# Patient Record
Sex: Male | Born: 1995 | Race: Black or African American | Hispanic: No | Marital: Single | Smoking: Current every day smoker
Health system: Southern US, Community
[De-identification: ages and names within clinical notes are randomized; demographics above are authoritative.]

## PROBLEM LIST (undated history)

## (undated) DIAGNOSIS — J45909 Unspecified asthma, uncomplicated: Secondary | ICD-10-CM

---
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
## 2013-03-14 ENCOUNTER — Encounter (HOSPITAL_COMMUNITY): Payer: Self-pay | Admitting: Emergency Medicine

## 2013-03-14 ENCOUNTER — Emergency Department (HOSPITAL_COMMUNITY)
Admission: EM | Admit: 2013-03-14 | Discharge: 2013-03-14 | Disposition: A | Discharge status: Home / Self Care | Payer: Medicaid Other | Attending: Emergency Medicine | Admitting: Emergency Medicine

## 2013-03-14 DIAGNOSIS — A6000 Herpesviral infection of urogenital system, unspecified: Secondary | ICD-10-CM | POA: Insufficient documentation

## 2013-03-14 DIAGNOSIS — R369 Urethral discharge, unspecified: Secondary | ICD-10-CM | POA: Insufficient documentation

## 2013-03-14 DIAGNOSIS — R300 Dysuria: Secondary | ICD-10-CM | POA: Insufficient documentation

## 2013-03-14 DIAGNOSIS — R21 Rash and other nonspecific skin eruption: Secondary | ICD-10-CM | POA: Insufficient documentation

## 2013-03-14 DIAGNOSIS — N342 Other urethritis: Secondary | ICD-10-CM

## 2013-03-14 DIAGNOSIS — F17200 Nicotine dependence, unspecified, uncomplicated: Secondary | ICD-10-CM | POA: Insufficient documentation

## 2013-03-14 DIAGNOSIS — J45909 Unspecified asthma, uncomplicated: Secondary | ICD-10-CM | POA: Insufficient documentation

## 2013-03-14 MED ORDER — CEFTRIAXONE SODIUM 250 MG IJ SOLR
250.0000 mg | Freq: Once | INTRAMUSCULAR | Status: AC
  Administered 2013-03-14: 250 mg via INTRAMUSCULAR
  Filled 2013-03-14: qty 250

## 2013-03-14 MED ORDER — LIDOCAINE HCL (PF) 1 % IJ SOLN
INTRAMUSCULAR | Status: AC
  Filled 2013-03-14: qty 5

## 2013-03-14 MED ORDER — AZITHROMYCIN 250 MG PO TABS
1000.0000 mg | ORAL_TABLET | Freq: Once | ORAL | Status: AC
  Administered 2013-03-14: 1000 mg via ORAL
  Filled 2013-03-14: qty 4

## 2013-03-14 MED ORDER — VALACYCLOVIR HCL 1 G PO TABS: 1000.0000 mg | ORAL_TABLET | Freq: Two times a day (BID) | ORAL | Status: AC
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
## 2013-03-14 NOTE — ED Provider Notes (Signed)
CSN: [id number]     Arrival date & time 03/14/13  1616 History  This chart was scribed for non-physician practitioner Rhea Bleacher, PA-C, working with Gilda Crease, by Yevette Edwards, ED Scribe. This patient was seen in room TR08C/TR08C and the patient's care was started at 4:40 PM.   First MD Initiated Contact with Patient 03/14/13 1624     Chief Complaint  Patient presents with  . Exposure to STD  . Penile Discharge    Patient is a 17 y.o. male presenting with penile discharge. The history is provided by the patient. No language interpreter was used.  Penile Discharge   HPI Comments: Ian Morgan is a 17 y.o. male who presents to the Emergency Department complaining of a suspected STD which may have occurred five days ago when he had unprotected sexual intercourse. The pt states he has noticed a rash to the shaftof his penis and he has experienced dysuria. Itching occurred prior to appearance of rash. He has also experienced clear penile discharge. He denies experiencing a fever, chills, rash, or sore throat. The pt denies a h/o STDs. The onset of this condition is acute. The course is constant. The aggravating factors are none. The alleviating factors are none.   Past Medical History  Diagnosis Date  . Asthma    Past Surgical History  Procedure Laterality Date  . Nerve and tendon repair  09/04/2012    Procedure: NERVE AND TENDON REPAIR;  Surgeon: Sharma Covert, MD;  Location: [facility] OR;  Service: Orthopedics;  Laterality: Right;   History reviewed. No pertinent family history. History  Substance Use Topics  . Smoking status: Current Every Day Smoker -- 0.50 packs/day    Types: Cigarettes  . Smokeless tobacco: Not on file  . Alcohol Use: Yes     Comment: occasion    Review of Systems  Constitutional: Negative for fever and chills.  HENT: Negative for sore throat.   Eyes: Negative for discharge.  Gastrointestinal: Negative for rectal pain.  Genitourinary: Positive for  dysuria (Burning. ), discharge and genital sores. Negative for frequency, penile pain and testicular pain.       Bumps on penis.   Musculoskeletal: Negative for arthralgias.  Skin: Negative for rash.  Hematological: Negative for adenopathy.    Allergies  Shellfish allergy  Home Medications   Current Outpatient Rx  Name  Route  Sig  Dispense  Refill  . albuterol (PROVENTIL HFA;VENTOLIN HFA) 108 (90 BASE) MCG/ACT inhaler   Inhalation   Inhale 2 puffs into the lungs every 6 (six) hours as needed. For shortness of breath         . docusate sodium (COLACE) 100 MG capsule   Oral   Take 1 capsule (100 mg total) by mouth 2 (two) times daily.   30 capsule   0   . oxyCODONE-acetaminophen (PERCOCET) 10-325 MG per tablet   Oral   Take 1 tablet by mouth every 4 (four) hours as needed for pain.   40 tablet   0    Triage Vitals: BP 131/74  Pulse 79  Temp(Src) 98.4 F (36.9 C) (Oral)  Resp 18  SpO2 99% Physical Exam  Nursing note and vitals reviewed. Constitutional: He appears well-developed and well-nourished.  HENT:  Head: Normocephalic and atraumatic.  Eyes: Conjunctivae are normal.  Neck: Normal range of motion. Neck supple.  Pulmonary/Chest: No respiratory distress.  Genitourinary:    Right testis shows no mass, no swelling and no tenderness. Left testis shows no mass,  no swelling and no tenderness. Circumcised. No phimosis or penile erythema. Discharge (clear) found.  Neurological: He is alert.  Skin: Skin is warm and dry.  Psychiatric: He has a normal mood and affect.    ED Course   DIAGNOSTIC STUDIES: Oxygen Saturation is 99% on room air, normal by my interpretation.    COORDINATION OF CARE:  4:42 PM- Discussed treatment plan with patient, and the patient agreed to the plan.   Procedures (including critical care time)  Labs Reviewed - No data to display No results found. 1. Urethritis   2. Herpes genitalia     Patient seen and examined. Work-up  initiated. Medications ordered.   Vital signs reviewed and are as follows: Filed Vitals:   03/14/13 1631  BP: 131/74  Pulse: 79  Temp: 98.4 F (36.9 C)  Resp: 18   Pt seen and examined.  Will test and treat for STD exposure.  Patient counseled on safe sexual practices.  Told them that they should not have sexual contact for next 7 days and that they need to inform sexual partners so that they can get tested and treated as well.  Urged f/u with [place] [facility] for HIV and syphilis testing.  Patient verbalizes understanding and agrees with plan.     MDM  Patient with urethritis, genital herpes. Patient counseled. Rx given for the diagnosis of herpes. Do not suspect syphilis.  I personally performed the services described in this documentation, which was scribed in my presence. The recorded information has been reviewed and is accurate.    Renne Crigler, PA-C 03/14/13 1753

## 2013-03-15 NOTE — ED Provider Notes (Signed)
Medical screening examination/treatment/procedure(s) were performed by non-physician practitioner and as supervising physician I was immediately available for consultation/collaboration.   Gilda Crease, MD 03/15/13 1745

## 2013-03-17 LAB — GC/CHLAMYDIA PROBE AMP: CT Probe RNA: POSITIVE — AB

## 2013-03-19 NOTE — ED Notes (Signed)
+   Chlamydia Patient treated with Rocephin And Zithromax-

## 2013-05-15 ENCOUNTER — Encounter (HOSPITAL_COMMUNITY): Payer: Self-pay | Admitting: *Deleted

## 2013-05-15 ENCOUNTER — Emergency Department (HOSPITAL_COMMUNITY)
Admission: EM | Admit: 2013-05-15 | Discharge: 2013-05-15 | Disposition: A | Discharge status: Home / Self Care | Payer: Medicaid Other | Attending: Emergency Medicine | Admitting: Emergency Medicine

## 2013-05-15 DIAGNOSIS — J45901 Unspecified asthma with (acute) exacerbation: Secondary | ICD-10-CM | POA: Insufficient documentation

## 2013-05-15 DIAGNOSIS — J069 Acute upper respiratory infection, unspecified: Secondary | ICD-10-CM | POA: Insufficient documentation

## 2013-05-15 DIAGNOSIS — F17200 Nicotine dependence, unspecified, uncomplicated: Secondary | ICD-10-CM | POA: Insufficient documentation

## 2013-05-15 DIAGNOSIS — Z79899 Other long term (current) drug therapy: Secondary | ICD-10-CM | POA: Insufficient documentation

## 2013-05-15 DIAGNOSIS — R51 Headache: Secondary | ICD-10-CM | POA: Insufficient documentation

## 2013-05-15 MED ORDER — ALBUTEROL SULFATE HFA 108 (90 BASE) MCG/ACT IN AERS
2.0000 | INHALATION_SPRAY | RESPIRATORY_TRACT | Status: DC | PRN
  Administered 2013-05-15: 2 via RESPIRATORY_TRACT
  Filled 2013-05-15: qty 6.7

## 2013-05-15 MED ORDER — ALBUTEROL SULFATE HFA 108 (90 BASE) MCG/ACT IN AERS: 1.0000 | INHALATION_SPRAY | Freq: Four times a day (QID) | RESPIRATORY_TRACT | Status: DC | PRN

## 2013-05-15 MED ORDER — AZITHROMYCIN 250 MG PO TABS: 250.0000 mg | ORAL_TABLET | Freq: Every day | ORAL | Status: DC

## 2013-05-15 NOTE — ED Notes (Signed)
Pt with hx of asthma to ED c/o increasing sob since Sunday.  Does not own inhaler and smokes 1/2 pack cigs per day. VS stable.

## 2013-05-15 NOTE — ED Notes (Signed)
Pt reports hx of asthma but does not have inhaler.  Pt states he has felt his chest getting tighter since Monday.  Productive cough and exp. Wheeze, no obvious distress.  Pt alert oriented X4

## 2013-05-15 NOTE — ED Provider Notes (Signed)
CSN: [id number]     Arrival date & time 05/15/13  1045 History  This chart was scribed for Ian Furno G. Neva Seat, PA, working with Ian Gaskins, MD, by Ian Morgan, ED Scribe. This patient was seen in room TR10C/TR10C and the patient's care was started at 11:39 AM.  Chief Complaint  Patient presents with  . Asthma   The history is provided by the patient. No language interpreter was used.    HPI Comments: Ian Morgan is a 17 y.o. male who presents to the Emergency Department complaining of gradually worsening, constant SOB which began 4 days ago. Pt reports he has subjective seasonal cold symptoms which worsens his asthma. Pt has associated sore throat, rhinorrhea, headache, wheezing, and SOB. Pt currently does not have an inhaler. Pt denies fever, chills, chest pain or any other symptoms.      Past Medical History  Diagnosis Date  . Asthma    Past Surgical History  Procedure Laterality Date  . Nerve and tendon repair  09/04/2012    Procedure: NERVE AND TENDON REPAIR;  Surgeon: Ian Covert, MD;  Location: [facility] OR;  Service: Orthopedics;  Laterality: Right;   No family history on file. History  Substance Use Topics  . Smoking status: Current Every Day Smoker -- 0.50 packs/day    Types: Cigarettes  . Smokeless tobacco: Not on file  . Alcohol Use: Yes     Comment: occasion    Review of Systems  Constitutional: Negative for fever and chills.  HENT: Positive for sore throat and rhinorrhea.   Respiratory: Positive for cough, shortness of breath and wheezing. Negative for chest tightness.   Cardiovascular: Negative for chest pain.  Neurological: Positive for headaches.  All other systems reviewed and are negative.    Allergies  Shellfish allergy  Home Medications   Current Outpatient Rx  Name  Route  Sig  Dispense  Refill  . Ibuprofen (IBU PO)   Oral   Take 1 tablet by mouth every 6 (six) hours as needed (pain).         Marland Kitchen albuterol (PROVENTIL HFA;VENTOLIN HFA)  108 (90 BASE) MCG/ACT inhaler   Inhalation   Inhale 1-2 puffs into the lungs every 6 (six) hours as needed for wheezing.   1 Inhaler   2   . azithromycin (ZITHROMAX) 250 MG tablet   Oral   Take 1 tablet (250 mg total) by mouth daily. Take first 2 tablets together, then 1 every day until finished.   6 tablet   0    Triage Vitals: BP 136/83  Pulse 79  Temp(Src) 98.5 F (36.9 C) (Oral)  Resp 18  SpO2 98%  Physical Exam  Nursing note and vitals reviewed. Constitutional: He is oriented to person, place, and time. He appears well-developed and well-nourished. No distress.  HENT:  Head: Normocephalic and atraumatic.  Right Ear: External ear normal.  Left Ear: External ear normal.  Nose: Nose normal.  Eyes: Conjunctivae are normal.  Neck: Neck supple.  Pulmonary/Chest: He has wheezes (diffuse wheezing ). He has no rales. He exhibits no tenderness.  Neurological: He is alert and oriented to person, place, and time.  Skin: Skin is warm and dry. He is not diaphoretic.  Psychiatric: He has a normal mood and affect.    ED Course  Procedures (including critical care time)  DIAGNOSTIC STUDIES: Oxygen Saturation is 98% on RA, normal by my interpretation.    COORDINATION OF CARE: 11:41 AM- Pt advised of plan for treatment which  includes albuterol inhaler and pt agrees. Will prescribe albuterol inhaler and antibiotic  Medications  albuterol (PROVENTIL HFA;VENTOLIN HFA) 108 (90 BASE) MCG/ACT inhaler 2 puff (2 puffs Inhalation Given 05/15/13 1146)   Labs Review Labs Reviewed - No data to display Imaging Review No results found.  MDM   1. URI (upper respiratory infection)    17 y.o.Ian Morgan's evaluation in the Emergency Department is complete. It has been determined that no acute conditions requiring further emergency intervention are present at this time. The patient/guardian have been advised of the diagnosis and plan. We have discussed signs and symptoms that warrant return  to the ED, such as changes or worsening in symptoms.  Vital signs are stable at discharge. Filed Vitals:   05/15/13 1206  BP: 138/80  Pulse: 70  Temp: 98.8 F (37.1 C)  Resp:     Patient/guardian has voiced understanding and agreed to follow-up with the PCP or specialist.  I personally performed the services described in this documentation, which was scribed in my presence. The recorded information has been reviewed and is accurate.   Ian Matas, PA-C 05/15/13 1216

## 2013-05-16 NOTE — ED Provider Notes (Signed)
Medical screening examination/treatment/procedure(s) were performed by non-physician practitioner and as supervising physician I was immediately available for consultation/collaboration.   Joya Gaskins, MD 05/16/13 [phone]

## 2013-11-05 ENCOUNTER — Emergency Department (HOSPITAL_COMMUNITY): Payer: Medicaid Other

## 2013-11-05 ENCOUNTER — Encounter (HOSPITAL_COMMUNITY): Payer: Self-pay | Admitting: Emergency Medicine

## 2013-11-05 ENCOUNTER — Emergency Department (HOSPITAL_COMMUNITY)
Admission: EM | Admit: 2013-11-05 | Discharge: 2013-11-05 | Disposition: A | Discharge status: Home / Self Care | Payer: Medicaid Other | Attending: Emergency Medicine | Admitting: Emergency Medicine

## 2013-11-05 DIAGNOSIS — IMO0002 Reserved for concepts with insufficient information to code with codable children: Secondary | ICD-10-CM | POA: Insufficient documentation

## 2013-11-05 DIAGNOSIS — Z79899 Other long term (current) drug therapy: Secondary | ICD-10-CM | POA: Insufficient documentation

## 2013-11-05 DIAGNOSIS — F17200 Nicotine dependence, unspecified, uncomplicated: Secondary | ICD-10-CM | POA: Insufficient documentation

## 2013-11-05 DIAGNOSIS — J45901 Unspecified asthma with (acute) exacerbation: Secondary | ICD-10-CM

## 2013-11-05 MED ORDER — PREDNISONE 20 MG PO TABS
60.0000 mg | ORAL_TABLET | Freq: Once | ORAL | Status: AC
  Administered 2013-11-05: 60 mg via ORAL
  Filled 2013-11-05: qty 3

## 2013-11-05 MED ORDER — FLUTICASONE PROPIONATE HFA 44 MCG/ACT IN AERO: 2.0000 | INHALATION_SPRAY | Freq: Two times a day (BID) | RESPIRATORY_TRACT | Status: AC

## 2013-11-05 MED ORDER — PREDNISONE 20 MG PO TABS: ORAL_TABLET | ORAL | Status: DC

## 2013-11-05 MED ORDER — ALBUTEROL SULFATE (2.5 MG/3ML) 0.083% IN NEBU
5.0000 mg | INHALATION_SOLUTION | Freq: Once | RESPIRATORY_TRACT | Status: AC
  Administered 2013-11-05: 5 mg via RESPIRATORY_TRACT
  Filled 2013-11-05: qty 6

## 2013-11-05 MED ORDER — ALBUTEROL SULFATE HFA 108 (90 BASE) MCG/ACT IN AERS
2.0000 | INHALATION_SPRAY | Freq: Four times a day (QID) | RESPIRATORY_TRACT | Status: DC | PRN
  Filled 2013-11-05: qty 6.7

## 2013-11-05 NOTE — ED Notes (Addendum)
Pt reports hx of asthma, has not used inhaler today. Pt reports productive cough yellow/green sputum, shortness of breath, and mid sternal chest pain upon inspiration x3 days. Shortness of breath increased today. Pt able to speak in full sentances with no distress. Pain 5/10. Expiratory wheezes heard in right lower lobes.   Hx 5 year smoker, 1/2 pack/day

## 2013-11-05 NOTE — Discharge Instructions (Signed)
Asthma, Adult Asthma is a recurring condition in which the airways tighten and narrow. Asthma can make it difficult to breathe. It can cause coughing, wheezing, and shortness of breath. Asthma episodes (also called asthma attacks) range from minor to life-threatening. Asthma cannot be cured, but medicines and lifestyle changes can help control it. CAUSES Asthma is believed to be caused by inherited (genetic) and environmental factors, but its exact cause is unknown. Asthma may be triggered by allergens, lung infections, or irritants in the air. Asthma triggers are different for each person. Common triggers include:   Animal dander.  Dust mites.  Cockroaches.  Pollen from trees or grass.  Mold.  Smoke.  Air pollutants such as dust, household cleaners, hair sprays, aerosol sprays, paint fumes, strong chemicals, or strong odors.  Cold air, weather changes, and winds (which increase molds and pollens in the air).  Strong emotional expressions such as crying or laughing hard.  Stress.  Certain medicines (such as aspirin) or types of drugs (such as beta-blockers).  Sulfites in foods and drinks. Foods and drinks that may contain sulfites include dried fruit, potato chips, and sparkling grape juice.  Infections or inflammatory conditions such as the flu, a cold, or an inflammation of the nasal membranes (rhinitis).  Gastroesophageal reflux disease (GERD).  Exercise or strenuous activity. SYMPTOMS Symptoms may occur immediately after asthma is triggered or many hours later. Symptoms include:  Wheezing.  Excessive nighttime or early morning coughing.  Frequent or severe coughing with a common cold.  Chest tightness.  Shortness of breath. DIAGNOSIS  The diagnosis of asthma is made by a review of your medical history and a physical exam. Tests may also be performed. These may include:  Lung function studies. These tests show how much air you breath in and out.  Allergy  tests.  Imaging tests such as X-rays. TREATMENT  Asthma cannot be cured, but it can usually be controlled. Treatment involves identifying and avoiding your asthma triggers. It also involves medicines. There are 2 classes of medicine used for asthma treatment:   Controller medicines. These prevent asthma symptoms from occurring. They are usually taken every day.  Reliever or rescue medicines. These quickly relieve asthma symptoms. They are used as needed and provide short-term relief. Your health care provider will help you create an asthma action plan. An asthma action plan is a written plan for managing and treating your asthma attacks. It includes a list of your asthma triggers and how they may be avoided. It also includes information on when medicines should be taken and when their dosage should be changed. An action plan may also involve the use of a device called a peak flow meter. A peak flow meter measures how well the lungs are working. It helps you monitor your condition. HOME CARE INSTRUCTIONS   Take medicine as directed by your health care provider. Speak with your health care provider if you have questions about how or when to take the medicines.  Use a peak flow meter as directed by your health care provider. Record and keep track of readings.  Understand and use the action plan to help minimize or stop an asthma attack without needing to seek medical care.  Control your home environment in the following ways to help prevent asthma attacks:  Do not smoke. Avoid being exposed to secondhand smoke.  Change your heating and air conditioning filter regularly.  Limit your use of fireplaces and wood stoves.  Get rid of pests (such as roaches and   mice) and their droppings.  Throw away plants if you see mold on them.  Clean your floors and dust regularly. Use unscented cleaning products.  Try to have someone else vacuum for you regularly. Stay out of rooms while they are being  vacuumed and for a short while afterward. If you vacuum, use a dust mask from a hardware store, a double-layered or microfilter vacuum cleaner bag, or a vacuum cleaner with a HEPA filter.  Replace carpet with wood, tile, or vinyl flooring. Carpet can trap dander and dust.  Use allergy-proof pillows, mattress covers, and box spring covers.  Wash bed sheets and blankets every week in hot water and dry them in a dryer.  Use blankets that are made of polyester or cotton.  Clean bathrooms and kitchens with bleach. If possible, have someone repaint the walls in these rooms with mold-resistant paint. Keep out of the rooms that are being cleaned and painted.  Wash hands frequently. SEEK MEDICAL CARE IF:   You have wheezing, shortness of breath, or a cough even if taking medicine to prevent attacks.  The colored mucus you cough up (sputum) is thicker than usual.  Your sputum changes from clear or white to yellow, green, gray, or bloody.  You have any problems that may be related to the medicines you are taking (such as a rash, itching, swelling, or trouble breathing).  You are using a reliever medicine more than 2 3 times per week.  Your peak flow is still at 50 79% of you personal best after following your action plan for 1 hour. SEEK IMMEDIATE MEDICAL CARE IF:   You seem to be getting worse and are unresponsive to treatment during an asthma attack.  You are short of breath even at rest.  You get short of breath when doing very little physical activity.  You have difficulty eating, drinking, or talking due to asthma symptoms.  You develop chest pain.  You develop a fast heartbeat.  You have a bluish color to your lips or fingernails.  You are lightheaded, dizzy, or faint.  Your peak flow is less than 50% of your personal best.  You have a fever or persistent symptoms for more than 2 3 days.  You have a fever and symptoms suddenly get worse. MAKE SURE YOU:   Understand these  instructions.  Will watch your condition.  Will get help right away if you are not doing well or get worse. Document Released: 07/31/2005 Document Revised: 04/02/2013 Document Reviewed: 02/27/2013 [employer] Patient Information 2014 [employer].  

## 2013-11-05 NOTE — ED Provider Notes (Signed)
CSN: [id number]     Arrival date & time 11/05/13  0904 History   First MD Initiated Contact with Patient 11/05/13 [phone]     Chief Complaint  Patient presents with  . Shortness of Breath     (Consider location/radiation/quality/duration/timing/severity/associated sxs/prior Treatment) HPI Comments: Patient presents with wheezing and shortness of breath. He has a history of asthma and he continues to smoke cigarettes. He states over the last 3 days he's had worsening shortness of breath and wheezing. He feels like he has a "plug" in the middle of his chest which is worse when he takes a deep breath. He denies any leg pain or swelling. He denies any fevers or chills. He does have a cough productive of some occasional green sputum. He uses an albuterol MDI at home for symptomatic OB with his asthma and is been using it frequently over the last few days with some improvement in symptoms but he says it comes right back.  Patient is a 18 y.o. male presenting with shortness of breath.  Shortness of Breath Associated symptoms: chest pain, cough and wheezing   Associated symptoms: no abdominal pain, no diaphoresis, no fever, no headaches, no rash and no vomiting     Past Medical History  Diagnosis Date  . Asthma    Past Surgical History  Procedure Laterality Date  . Nerve and tendon repair  09/04/2012    Procedure: NERVE AND TENDON REPAIR;  Surgeon: Sharma Covert, MD;  Location: [facility] OR;  Service: Orthopedics;  Laterality: Right;   History reviewed. No pertinent family history. History  Substance Use Topics  . Smoking status: Current Every Day Smoker -- 0.50 packs/day    Types: Cigarettes  . Smokeless tobacco: Not on file  . Alcohol Use: Yes     Comment: occasion    Review of Systems  Constitutional: Negative for fever, chills, diaphoresis and fatigue.  HENT: Negative for congestion, rhinorrhea and sneezing.   Eyes: Negative.   Respiratory: Positive for cough, shortness of breath and  wheezing. Negative for chest tightness.   Cardiovascular: Positive for chest pain. Negative for leg swelling.  Gastrointestinal: Negative for nausea, vomiting, abdominal pain, diarrhea and blood in stool.  Genitourinary: Negative for frequency, hematuria, flank pain and difficulty urinating.  Musculoskeletal: Negative for arthralgias and back pain.  Skin: Negative for rash.  Neurological: Negative for dizziness, speech difficulty, weakness, numbness and headaches.      Allergies  Shellfish allergy  Home Medications   Current Outpatient Rx  Name  Route  Sig  Dispense  Refill  . albuterol (PROVENTIL HFA;VENTOLIN HFA) 108 (90 BASE) MCG/ACT inhaler   Inhalation   Inhale 1-2 puffs into the lungs every 6 (six) hours as needed for wheezing.   1 Inhaler   2   . azithromycin (ZITHROMAX) 250 MG tablet   Oral   Take 1 tablet (250 mg total) by mouth daily. Take first 2 tablets together, then 1 every day until finished.   6 tablet   0   . fluticasone (FLOVENT HFA) 44 MCG/ACT inhaler   Inhalation   Inhale 2 puffs into the lungs 2 (two) times daily.   1 Inhaler   1   . Ibuprofen (IBU PO)   Oral   Take 1 tablet by mouth every 6 (six) hours as needed (pain).         . predniSONE (DELTASONE) 20 MG tablet      2 po daily x 4 days   8 tablet  0    BP 145/83  Pulse 68  Temp(Src) 98.4 F (36.9 C) (Oral)  Resp 16  SpO2 100% Physical Exam  Constitutional: He is oriented to person, place, and time. He appears well-developed and well-nourished.  HENT:  Head: Normocephalic and atraumatic.  Eyes: Pupils are equal, round, and reactive to light.  Neck: Normal range of motion. Neck supple.  Cardiovascular: Normal rate, regular rhythm and normal heart sounds.   Pulmonary/Chest: Effort normal. No respiratory distress. He has wheezes. He has no rales. He exhibits no tenderness.  Positive expiratory wheezes but no increased work of breathing  Abdominal: Soft. Bowel sounds are normal.  There is no tenderness. There is no rebound and no guarding.  Musculoskeletal: Normal range of motion. He exhibits no edema.  No edema or calf tenderness  Lymphadenopathy:    He has no cervical adenopathy.  Neurological: He is alert and oriented to person, place, and time.  Skin: Skin is warm and dry. No rash noted.  Psychiatric: He has a normal mood and affect.    ED Course  Procedures (including critical care time) Labs Review Labs Reviewed - No data to display Imaging Review No results found.   EKG Interpretation None      MDM   Final diagnoses:  Asthma exacerbation    Patient presents with an asthma exacerbation. His chest x-ray is negative for infection. He's afebrile. He was given a nebulizer treatment here and had complete relief of symptoms. He currently denies any chest pain or shortness of breath. There's no other symptoms that would be more worrisome for pulmonary embolus. I did counsel him on tobacco cessation. He was given an albuterol MDI to take home. He was given a prescription for a four-day course of prednisone to start tomorrow and was given his first dose of prednisone in the ED today. He was also started on Flovent inhaler and encouraged to followup with his primary care physician for ongoing asthma management.    Rolan BuccoMelanie Cammie Faulstich, MD 11/05/13 1017

## 2014-08-01 IMAGING — CR DG HAND COMPLETE 3+V*R*
3 series · 3 of 3 positions shown · non-contrast
Comparison: None.

CLINICAL DATA: Puncture wound

RIGHT HAND - COMPLETE 3+ VIEW

[x hand pa right]
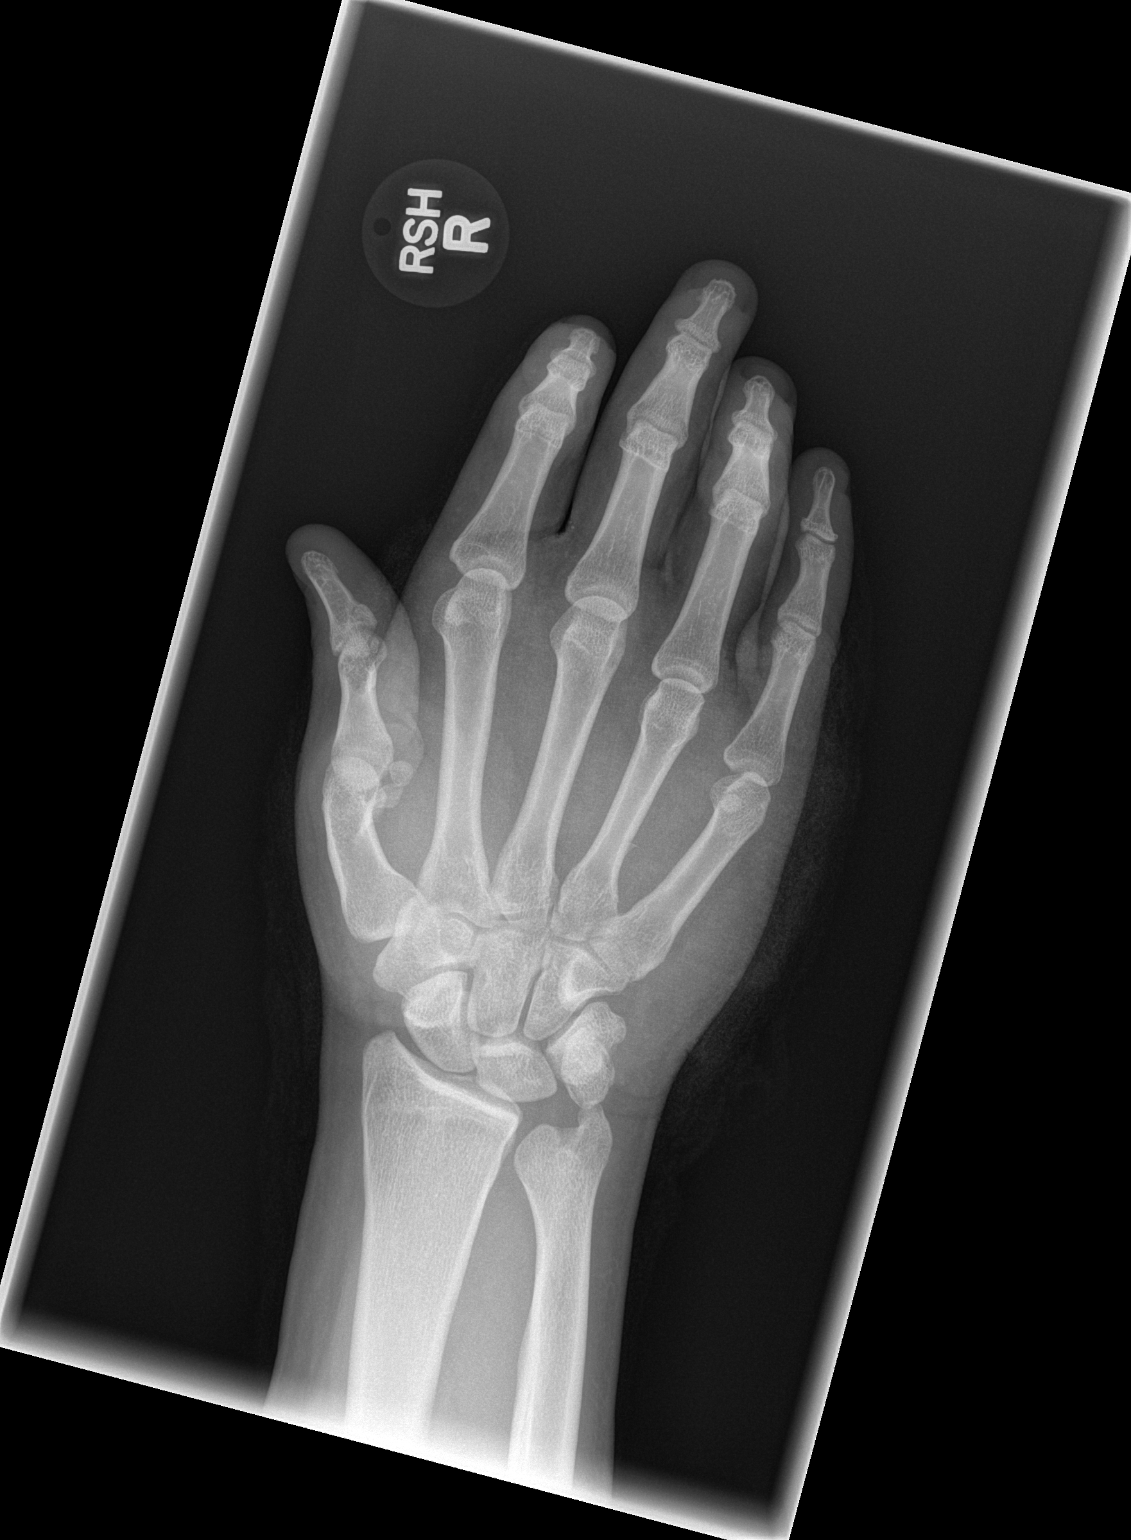

[x hand oblique right]
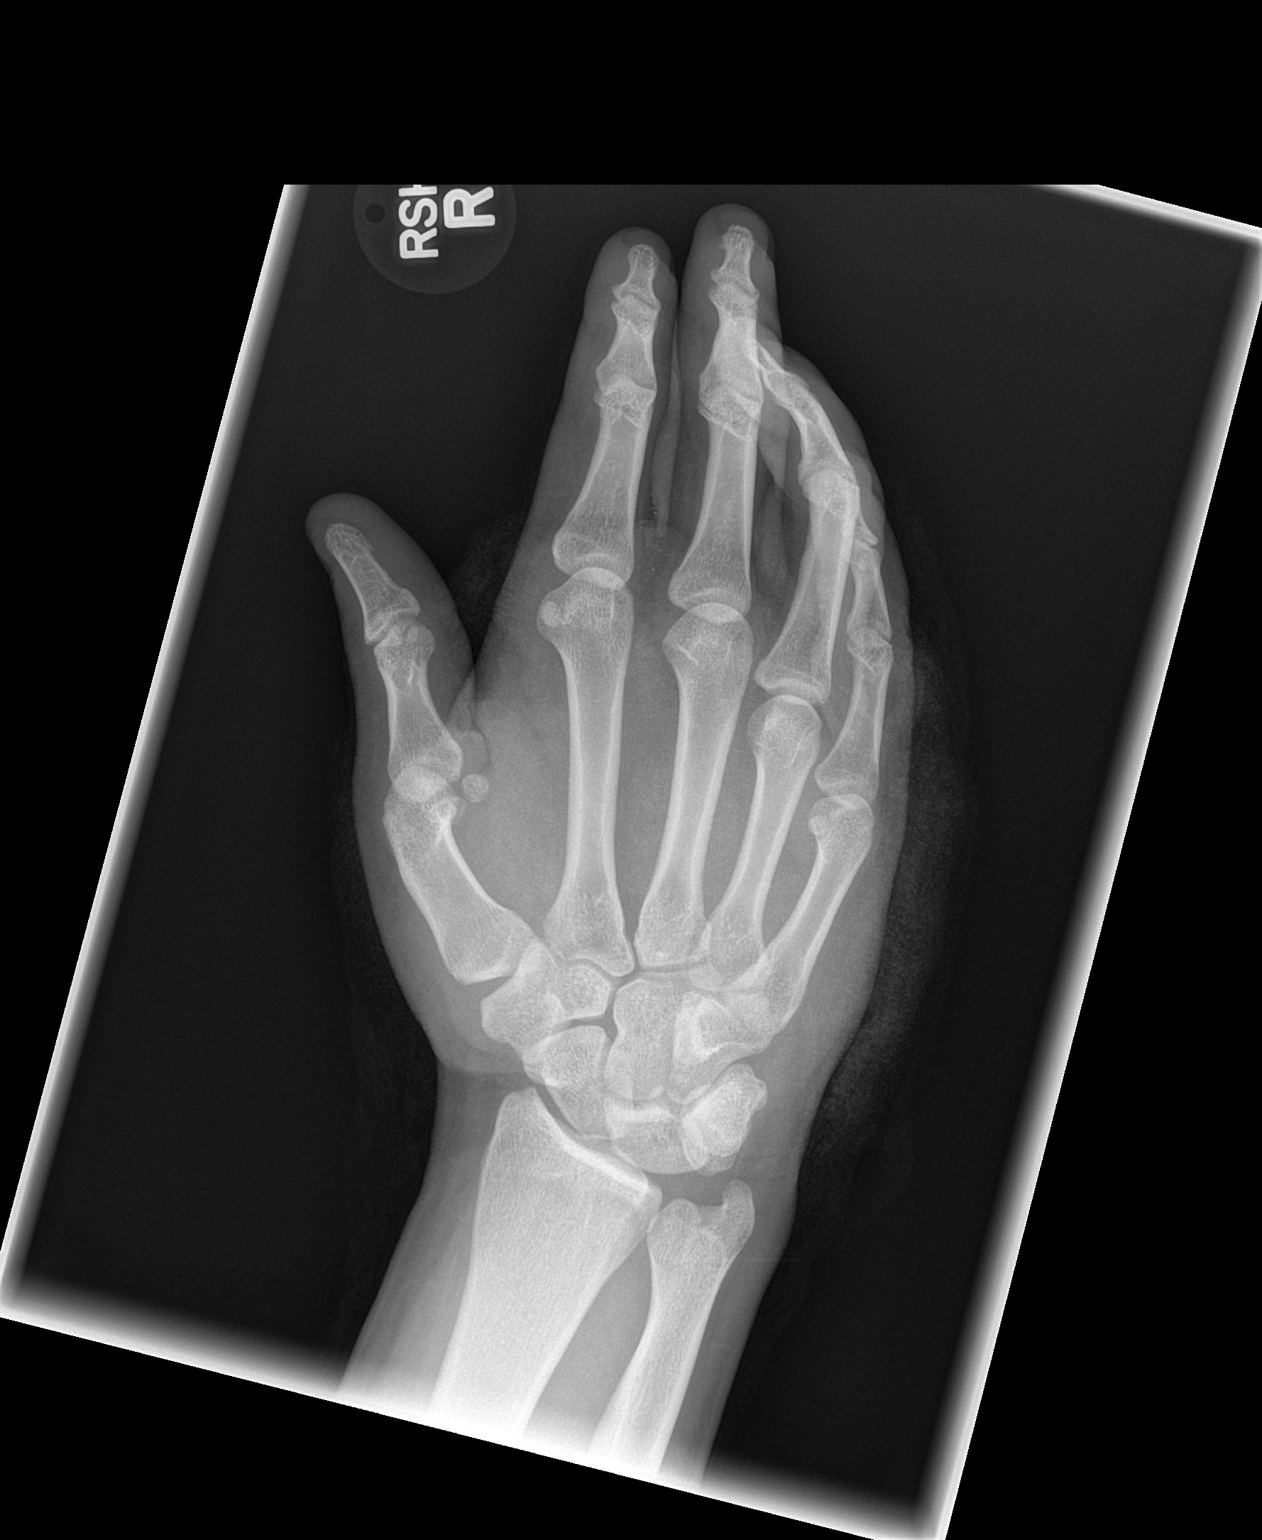

[x hand lat right]
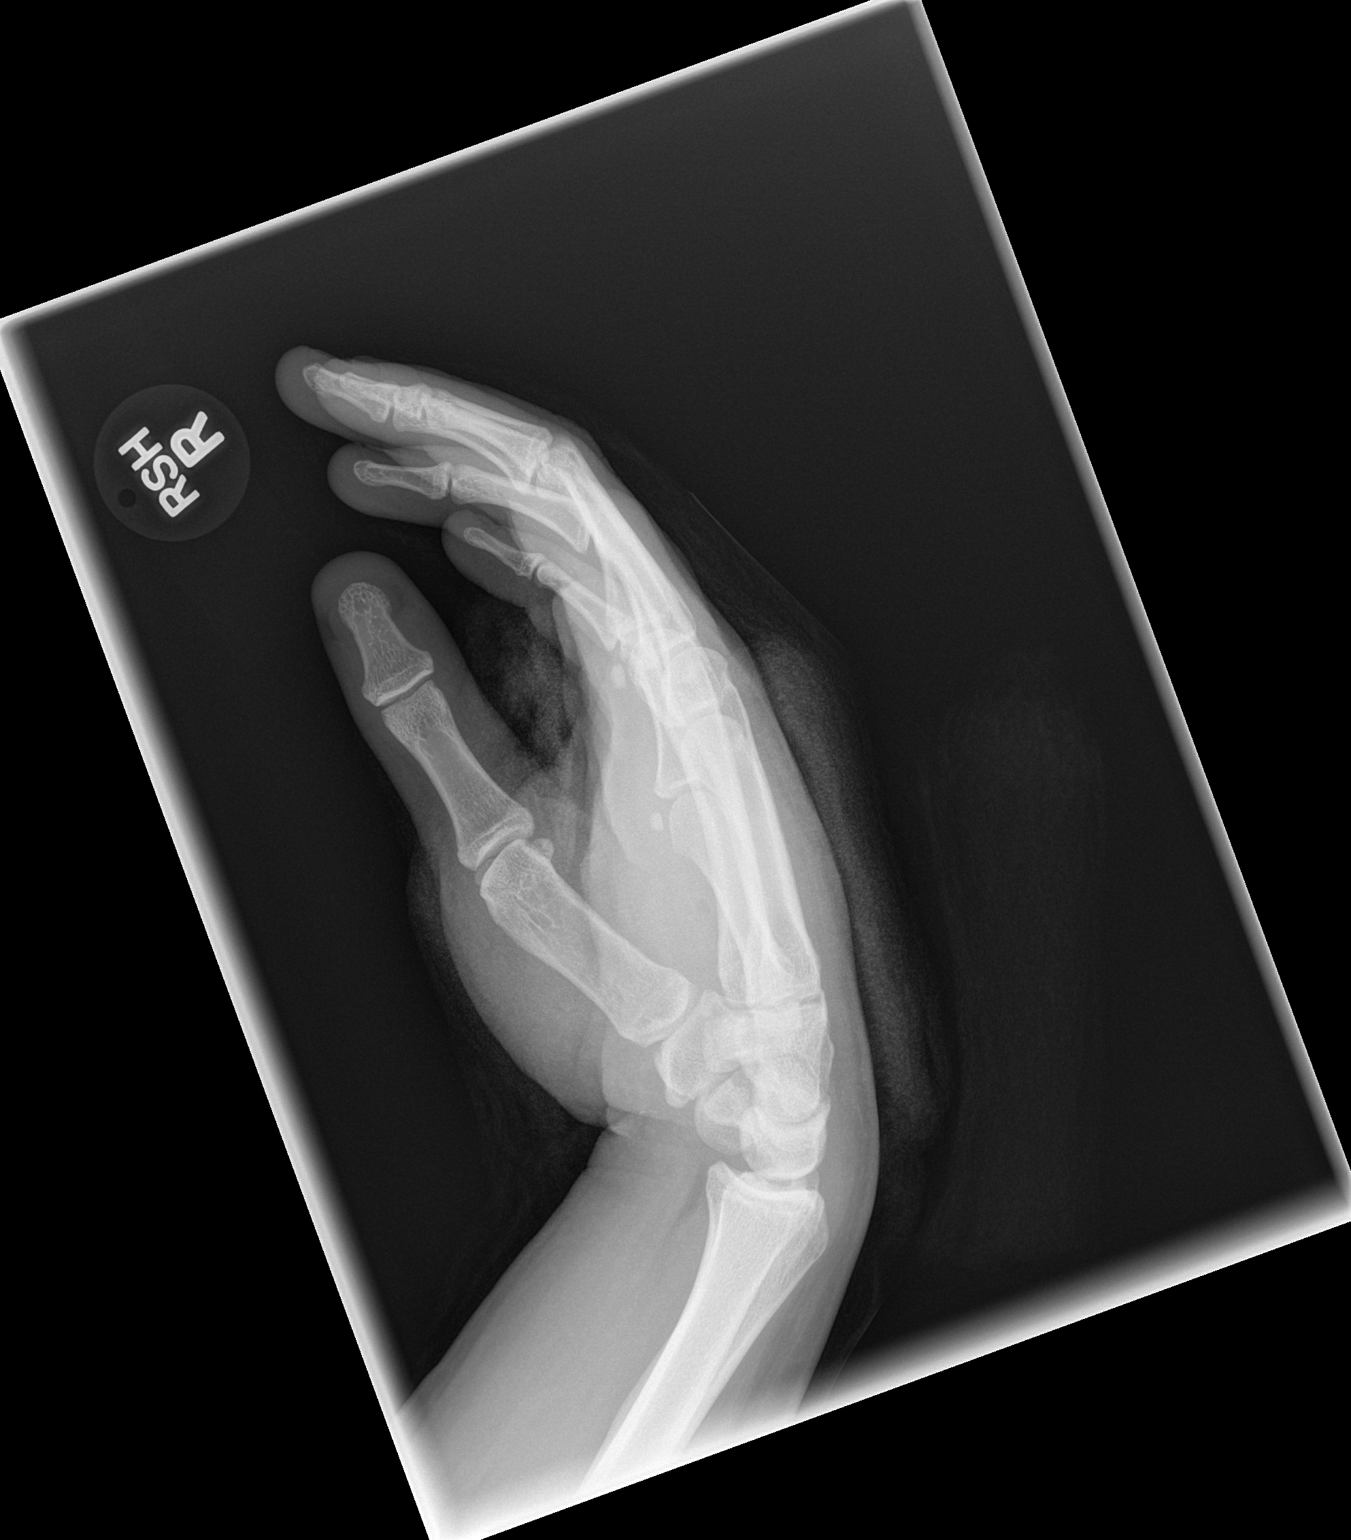

[3 of 3 positions shown; findings below may reference images not displayed]

FINDINGS: Three views of the right hand submitted.  There are
bandage artifacts.  No acute fracture or subluxation.  No
radiopaque foreign body.
IMPRESSION: No acute fracture or subluxation.  No radiopaque foreign body.

## 2017-05-04 ENCOUNTER — Emergency Department (HOSPITAL_COMMUNITY)
Admission: EM | Admit: 2017-05-04 | Discharge: 2017-05-04 | Disposition: A | Discharge status: Home / Self Care | Payer: Self-pay | Attending: Emergency Medicine | Admitting: Emergency Medicine

## 2017-05-04 ENCOUNTER — Encounter (HOSPITAL_COMMUNITY): Payer: Self-pay | Admitting: *Deleted

## 2017-05-04 DIAGNOSIS — Z5321 Procedure and treatment not carried out due to patient leaving prior to being seen by health care provider: Secondary | ICD-10-CM | POA: Insufficient documentation

## 2017-05-04 DIAGNOSIS — M542 Cervicalgia: Secondary | ICD-10-CM | POA: Insufficient documentation

## 2017-05-04 NOTE — ED Notes (Signed)
Called pt's name to obtain vitals, no one answered. 

## 2017-05-04 NOTE — ED Notes (Signed)
Patient not currently in assigned room.
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
## 2017-05-04 NOTE — ED Notes (Signed)
This RN called pt 3x in waiting room from different areas.  No response.

## 2017-05-09 NOTE — ED Provider Notes (Signed)
I did not see or evaluate the patient.  The patient left the emergency department prior to provider evaluation   Azalia Bilis, MD 05/09/17 [phone]

## 2017-09-11 ENCOUNTER — Encounter (HOSPITAL_COMMUNITY): Payer: Self-pay | Admitting: *Deleted

## 2017-09-11 ENCOUNTER — Emergency Department (HOSPITAL_COMMUNITY)
Admission: EM | Admit: 2017-09-11 | Discharge: 2017-09-11 | Disposition: A | Discharge status: Home / Self Care | Payer: Self-pay | Attending: Emergency Medicine | Admitting: Emergency Medicine

## 2017-09-11 ENCOUNTER — Emergency Department (HOSPITAL_COMMUNITY): Payer: Self-pay

## 2017-09-11 ENCOUNTER — Other Ambulatory Visit: Payer: Self-pay

## 2017-09-11 DIAGNOSIS — F17210 Nicotine dependence, cigarettes, uncomplicated: Secondary | ICD-10-CM | POA: Insufficient documentation

## 2017-09-11 DIAGNOSIS — J4521 Mild intermittent asthma with (acute) exacerbation: Secondary | ICD-10-CM | POA: Insufficient documentation

## 2017-09-11 MED ORDER — ALBUTEROL SULFATE HFA 108 (90 BASE) MCG/ACT IN AERS: 1.0000 | INHALATION_SPRAY | Freq: Four times a day (QID) | RESPIRATORY_TRACT | 0 refills | Status: AC | PRN

## 2017-09-11 MED ORDER — ALBUTEROL SULFATE (2.5 MG/3ML) 0.083% IN NEBU: 2.5000 mg | INHALATION_SOLUTION | Freq: Four times a day (QID) | RESPIRATORY_TRACT | 12 refills | Status: AC | PRN

## 2017-09-11 MED ORDER — ALBUTEROL SULFATE HFA 108 (90 BASE) MCG/ACT IN AERS
1.0000 | INHALATION_SPRAY | Freq: Once | RESPIRATORY_TRACT | Status: AC
  Administered 2017-09-11: 1 via RESPIRATORY_TRACT
  Filled 2017-09-11: qty 6.7

## 2017-09-11 MED ORDER — PREDNISONE 10 MG PO TABS: 40.0000 mg | ORAL_TABLET | Freq: Every day | ORAL | 0 refills | Status: AC

## 2017-09-11 NOTE — ED Triage Notes (Signed)
Pt states the weather is changing and he is out of breath by the time he gets up the steps.  Pt states he is out nebulizer and inhaler.

## 2017-09-11 NOTE — Discharge Instructions (Signed)
Take steroids as directed. Use your albuterol inhaler nebulizer as needed. Return to ED for worsening symptoms, chest pain, shortness of breath, increased wheezing, productive cough or coughing up blood.

## 2017-09-11 NOTE — ED Provider Notes (Signed)
[facility] EMERGENCY DEPARTMENT Provider Note   CSN: [id number] Arrival date & time: 09/11/17  1217     History   Chief Complaint Chief Complaint  Patient presents with  . Asthma    HPI Ian Morgan is a 22 y.o. male with a past medical history of asthma, who presents to ED for evaluation of asthma exacerbation symptoms for the past 4 days. He reports intermittent wheezing and SOB that has progressively gotten worse today. He returned from out of town last night where the weather was a lot warmer. He states ever since returning to [place], the cold air usually triggers his asthma. He ran out of his albuterol inhaler and nebulizer treatment several weeks ago.  He reports cough but denies productive cough. He denies chest pain, hemoptysis, fevers, trouble swallowing. He reports being hospitalized in the past for his asthma as a child but denies any history of intubation.  HPI  Past Medical History:  Diagnosis Date  . Asthma     There are no active problems to display for this patient.   Past Surgical History:  Procedure Laterality Date  . NERVE AND TENDON REPAIR  09/04/2012   Procedure: NERVE AND TENDON REPAIR;  Surgeon: Sharma Covert, MD;  Location: [facility] OR;  Service: Orthopedics;  Laterality: Right;       Home Medications    Prior to Admission medications   Medication Sig Start Date End Date Taking? Authorizing Provider  albuterol (PROVENTIL HFA;VENTOLIN HFA) 108 (90 Base) MCG/ACT inhaler Inhale 1-2 puffs into the lungs every 6 (six) hours as needed for wheezing or shortness of breath. 09/11/17   Faustina Gebert, PA-C  albuterol (PROVENTIL) (2.5 MG/3ML) 0.083% nebulizer solution Take 3 mLs (2.5 mg total) by nebulization every 6 (six) hours as needed for wheezing or shortness of breath. 09/11/17   Zayanna Pundt, PA-C  fluticasone (FLOVENT HFA) 44 MCG/ACT inhaler Inhale 2 puffs into the lungs 2 (two) times daily. 11/05/13   Rolan Bucco, MD  predniSONE  (DELTASONE) 10 MG tablet Take 4 tablets (40 mg total) by mouth daily for 5 days. 09/11/17 09/16/17  Dietrich Pates, PA-C    Family History No family history on file.  Social History Social History   Tobacco Use  . Smoking status: Current Every Day Smoker    Packs/day: 0.50    Types: Cigarettes  . Smokeless tobacco: Never Used  Substance Use Topics  . Alcohol use: Yes    Comment: occasion  . Drug use: No     Allergies   Shellfish allergy   Review of Systems Review of Systems  Constitutional: Negative for chills, diaphoresis, fatigue and fever.  HENT: Negative for congestion, rhinorrhea, sneezing and trouble swallowing.   Respiratory: Positive for cough, shortness of breath and wheezing. Negative for choking and chest tightness.   Cardiovascular: Negative for chest pain and palpitations.  Gastrointestinal: Negative for vomiting.  Skin: Negative for color change and wound.     Physical Exam Updated Vital Signs BP 140/78 (BP Location: Right Arm)   Pulse 73   Temp 98.4 F (36.9 C) (Oral)   Resp 18   SpO2 100%   Physical Exam  Constitutional: He appears well-developed and well-nourished. No distress.  Nontoxic appearing and in no acute distress.  Speaking in complete sentences with no signs of respiratory distress or airway compromise.  HENT:  Head: Normocephalic and atraumatic.  Eyes: Conjunctivae and EOM are normal. No scleral icterus.  Neck: Normal range of motion.  Cardiovascular: Normal  rate, regular rhythm and normal heart sounds.  Pulmonary/Chest: Effort normal. No respiratory distress. He has wheezes (RLL, LLL).  Neurological: He is alert.  Skin: No rash noted. He is not diaphoretic.  Psychiatric: He has a normal mood and affect.  Nursing note and vitals reviewed.    ED Treatments / Results  Labs (all labs ordered are listed, but only abnormal results are displayed) Labs Reviewed - No data to display  EKG  EKG Interpretation None        Radiology Dg Chest 2 View  Result Date: 09/11/2017 CLINICAL DATA:  Shortness of breath and cough for several days EXAM: CHEST  2 VIEW COMPARISON:  11/05/2013 FINDINGS: The heart size and mediastinal contours are within normal limits. Both lungs are clear. The visualized skeletal structures are unremarkable. IMPRESSION: No active cardiopulmonary disease. Electronically Signed   By: Alcide CleverMark  Lukens M.D.   On: 09/11/2017 14:43    Procedures Procedures (including critical care time)  Medications Ordered in ED Medications  albuterol (PROVENTIL HFA;VENTOLIN HFA) 108 (90 Base) MCG/ACT inhaler 1 puff (1 puff Inhalation Given 09/11/17 1418)     Initial Impression / Assessment and Plan / ED Course  I have reviewed the triage vital signs and the nursing notes.  Pertinent labs & imaging results that were available during my care of the patient were reviewed by me and considered in my medical decision making (see chart for details).     Patient presents to ED for evaluation of asthma exacerbation for the past 3 days.  Also states that he ran out of his albuterol nebulizer and inhaler several weeks ago.  On physical exam he is overall well-appearing.  He does have some mild end expiratory wheezing bilateral lung bases.  He is satting at 100% on room air.  He is not tachycardic or tachypneic he is speaking complete sentences without difficulty. Afebrile with no history of fever. Denies chest pain, hemoptysis. Chest x-ray returned as negative for acute abnormality.  He reports improvement in his wheezing with albuterol inhaler given here. I suspect asthma exacerbation as the cause of his symptoms. Will give steroids, refill for inhaler and nebulizer and advise him to return for any severe or worsening symptoms.  Portions of this note were generated with Scientist, clinical (histocompatibility and immunogenetics)Dragon dictation software. Dictation errors may occur despite best attempts at proofreading.   Final Clinical Impressions(s) / ED Diagnoses   Final  diagnoses:  Mild intermittent asthma with exacerbation    ED Discharge Orders        Ordered    predniSONE (DELTASONE) 10 MG tablet  Daily     09/11/17 1455    albuterol (PROVENTIL HFA;VENTOLIN HFA) 108 (90 Base) MCG/ACT inhaler  Every 6 hours PRN     09/11/17 1455    albuterol (PROVENTIL) (2.5 MG/3ML) 0.083% nebulizer solution  Every 6 hours PRN     09/11/17 1455       Dietrich PatesKhatri, Ishaaq Penna, PA-C 09/11/17 1459    Little, Ambrose Finlandachel Morgan, MD 09/14/17 [phone]

## 2017-09-11 NOTE — ED Notes (Signed)
Patient verbalized understanding of discharge instructions and denies any further needs or questions at this time. VS stable. Patient ambulatory with steady gait.  

## 2018-01-01 ENCOUNTER — Encounter (HOSPITAL_COMMUNITY): Payer: Self-pay | Admitting: Emergency Medicine

## 2018-01-01 ENCOUNTER — Ambulatory Visit (HOSPITAL_COMMUNITY)
Admission: EM | Admit: 2018-01-01 | Discharge: 2018-01-01 | Disposition: A | Discharge status: Home / Self Care | Payer: Medicaid Other | Attending: Family Medicine | Admitting: Family Medicine

## 2018-01-01 ENCOUNTER — Other Ambulatory Visit: Payer: Self-pay

## 2018-01-01 DIAGNOSIS — R369 Urethral discharge, unspecified: Secondary | ICD-10-CM | POA: Diagnosis not present

## 2018-01-01 DIAGNOSIS — F17210 Nicotine dependence, cigarettes, uncomplicated: Secondary | ICD-10-CM | POA: Insufficient documentation

## 2018-01-01 DIAGNOSIS — Z113 Encounter for screening for infections with a predominantly sexual mode of transmission: Secondary | ICD-10-CM | POA: Diagnosis not present

## 2018-01-01 DIAGNOSIS — Z202 Contact with and (suspected) exposure to infections with a predominantly sexual mode of transmission: Secondary | ICD-10-CM

## 2018-01-01 LAB — POCT URINALYSIS DIP (DEVICE)
GLUCOSE, UA: NEGATIVE mg/dL
Nitrite: NEGATIVE
Protein, ur: NEGATIVE mg/dL
SPECIFIC GRAVITY, URINE: 1.025 (ref 1.005–1.030)
Urobilinogen, UA: 0.2 mg/dL (ref 0.0–1.0)
pH: 6 (ref 5.0–8.0)

## 2018-01-01 MED ORDER — CEFTRIAXONE SODIUM 250 MG IJ SOLR
250.0000 mg | Freq: Once | INTRAMUSCULAR | Status: AC
  Administered 2018-01-01: 250 mg via INTRAMUSCULAR

## 2018-01-01 MED ORDER — AZITHROMYCIN 250 MG PO TABS
1000.0000 mg | ORAL_TABLET | Freq: Once | ORAL | Status: AC
  Administered 2018-01-01: 1000 mg via ORAL

## 2018-01-01 NOTE — Discharge Instructions (Signed)

## 2018-01-01 NOTE — ED Provider Notes (Signed)
[facility]   [id number] 01/01/18 Arrival Time: 1439  ASSESSMENT & PLAN:  1. Penile discharge   2. Exposure to STD    Discharge Instructions     You have been given the following medications today for treatment of suspected gonorrhea and/or chlamydia:  cefTRIAXone (ROCEPHIN) injection 250 mg azithromycin (ZITHROMAX) tablet 1,000 mg  Even though we have treated you today, we have sent testing for sexually transmitted infections. We will notify you of any positive results once they are received. If required, we will prescribe any medications you might need.  Please refrain from all sexual activity for at least the next seven days.  Pending: Labs Reviewed  POCT URINALYSIS DIP (DEVICE) - Abnormal; Notable for the following components:      Result Value   Bilirubin Urine SMALL (*)    Ketones, ur TRACE (*)    Hgb urine dipstick TRACE (*)    Leukocytes, UA SMALL (*)    All other components within normal limits  URINE CYTOLOGY ANCILLARY ONLY   Will notify of any positive results.  Reviewed expectations re: course of current medical issues. Questions answered. Outlined signs and symptoms indicating need for more acute intervention. Patient verbalized understanding. After Visit Summary given.   SUBJECTIVE:  Ian Morgan is a 22 y.o. male who presents with complaint of penile discharge. Reports he was told that his sexual partner has gonorrhea and chlamydia. Onset of discharge gradual, 1-2 weeks ago. Describes discharge as thick and white/yellow. Urinary symptoms: none. Afebrile. No abdominal or pelvic pain. No n/v. No rashes or lesions. Sexually active with single male partner. OTC treatment: none. History of similar symptoms: No.  ROS: As per HPI.  OBJECTIVE:  Vitals:   01/01/18 1522  BP: 131/78  Pulse: 84  Temp: 98.7 F (37.1 C)  TempSrc: Oral  SpO2: 99%    General appearance: alert, cooperative, appears stated age and no distress Throat: lips, mucosa,  and tongue normal; teeth and gums normal Back: no CVA tenderness Abdomen: soft, non-tender; bowel sounds normal; no masses or organomegaly; no guarding or rebound tenderness GU: declines Skin: warm and dry Psychological:  Alert and cooperative. Normal mood and affect.  Results for orders placed or performed during the hospital encounter of 01/01/18  POCT urinalysis dip (device)  Result Value Ref Range   Glucose, UA NEGATIVE NEGATIVE mg/dL   Bilirubin Urine SMALL (A) NEGATIVE   Ketones, ur TRACE (A) NEGATIVE mg/dL   Specific Gravity, Urine 1.025 1.005 - 1.030   Hgb urine dipstick TRACE (A) NEGATIVE   pH 6.0 5.0 - 8.0   Protein, ur NEGATIVE NEGATIVE mg/dL   Urobilinogen, UA 0.2 0.0 - 1.0 mg/dL   Nitrite NEGATIVE NEGATIVE   Leukocytes, UA SMALL (A) NEGATIVE    Labs Reviewed  POCT URINALYSIS DIP (DEVICE) - Abnormal; Notable for the following components:      Result Value   Bilirubin Urine SMALL (*)    Ketones, ur TRACE (*)    Hgb urine dipstick TRACE (*)    Leukocytes, UA SMALL (*)    All other components within normal limits  URINE CYTOLOGY ANCILLARY ONLY    Allergies  Allergen Reactions  . Shellfish Allergy Anaphylaxis    Past Medical History:  Diagnosis Date  . Asthma    History reviewed. No pertinent family history. Social History   Socioeconomic History  . Marital status: Single    Spouse name: Not on file  . Number of children: Not on file  . Years of  education: Not on file  . Highest education level: Not on file  Occupational History  . Not on file  Social Needs  . Financial resource strain: Not on file  . Food insecurity:    Worry: Not on file    Inability: Not on file  . Transportation needs:    Medical: Not on file    Non-medical: Not on file  Tobacco Use  . Smoking status: Current Every Day Smoker    Packs/day: 0.50    Types: Cigarettes  . Smokeless tobacco: Never Used  Substance and Sexual Activity  . Alcohol use: Yes    Comment: occasion    . Drug use: No  . Sexual activity: Not on file  Lifestyle  . Physical activity:    Days per week: Not on file    Minutes per session: Not on file  . Stress: Not on file  Relationships  . Social connections:    Talks on phone: Not on file    Gets together: Not on file    Attends religious service: Not on file    Active member of club or organization: Not on file    Attends meetings of clubs or organizations: Not on file    Relationship status: Not on file  . Intimate partner violence:    Fear of current or ex partner: Not on file    Emotionally abused: Not on file    Physically abused: Not on file    Forced sexual activity: Not on file  Other Topics Concern  . Not on file  Social History Narrative  . Not on file          Mardella Layman, MD 01/01/18 [phone]

## 2018-01-01 NOTE — ED Triage Notes (Signed)
Pt states he was exposed to gonorrhea and chlamydia.  He reports burning with urination and discharge x1 week.

## 2018-01-02 LAB — URINE CYTOLOGY ANCILLARY ONLY
Chlamydia: NEGATIVE
NEISSERIA GONORRHEA: POSITIVE — AB
TRICH (WINDOWPATH): NEGATIVE

## 2018-01-04 ENCOUNTER — Telehealth (HOSPITAL_COMMUNITY): Payer: Self-pay

## 2018-01-04 NOTE — Telephone Encounter (Signed)
Test for gonorrhea was positive. This was treated at the urgent care visit with IM rocephin 250mg and po zithromax 1g. Pt called regarding test results, instructed patient to refrain from sexual intercourse for 7 days after treatment to give the medicine time to work. Sexual partners need to be notified and tested/treated. Condoms may reduce risk of reinfection. Recheck or followup with PCP for further evaluation if symptoms are not improving. Answered all patient questions. [facility] notified.  

## 2018-03-16 ENCOUNTER — Encounter (HOSPITAL_COMMUNITY): Payer: Self-pay | Admitting: Emergency Medicine

## 2018-03-16 ENCOUNTER — Other Ambulatory Visit: Payer: Self-pay

## 2018-03-16 DIAGNOSIS — S81832A Puncture wound without foreign body, left lower leg, initial encounter: Principal | ICD-10-CM

## 2018-03-16 DIAGNOSIS — Z79899 Other long term (current) drug therapy: Secondary | ICD-10-CM | POA: Insufficient documentation

## 2018-03-16 DIAGNOSIS — Y999 Unspecified external cause status: Secondary | ICD-10-CM

## 2018-03-16 DIAGNOSIS — J4520 Mild intermittent asthma, uncomplicated: Secondary | ICD-10-CM

## 2018-03-16 DIAGNOSIS — Z23 Encounter for immunization: Secondary | ICD-10-CM

## 2018-03-16 DIAGNOSIS — Y9302 Activity, running: Secondary | ICD-10-CM | POA: Insufficient documentation

## 2018-03-16 DIAGNOSIS — Y929 Unspecified place or not applicable: Secondary | ICD-10-CM | POA: Insufficient documentation

## 2018-03-16 DIAGNOSIS — F17210 Nicotine dependence, cigarettes, uncomplicated: Secondary | ICD-10-CM

## 2018-03-16 DIAGNOSIS — W3400XA Accidental discharge from unspecified firearms or gun, initial encounter: Secondary | ICD-10-CM

## 2018-10-06 ENCOUNTER — Encounter (HOSPITAL_COMMUNITY): Payer: Self-pay | Admitting: Emergency Medicine

## 2018-10-06 ENCOUNTER — Ambulatory Visit (HOSPITAL_COMMUNITY)
Admission: EM | Admit: 2018-10-06 | Discharge: 2018-10-06 | Disposition: A | Discharge status: Home / Self Care | Payer: Medicaid Other | Attending: Emergency Medicine | Admitting: Emergency Medicine

## 2018-10-06 DIAGNOSIS — Z113 Encounter for screening for infections with a predominantly sexual mode of transmission: Secondary | ICD-10-CM

## 2018-10-06 DIAGNOSIS — Z202 Contact with and (suspected) exposure to infections with a predominantly sexual mode of transmission: Secondary | ICD-10-CM | POA: Insufficient documentation

## 2018-10-06 DIAGNOSIS — Z711 Person with feared health complaint in whom no diagnosis is made: Secondary | ICD-10-CM | POA: Insufficient documentation

## 2018-10-06 MED ORDER — CEFTRIAXONE SODIUM 250 MG IJ SOLR
250.0000 mg | Freq: Once | INTRAMUSCULAR | Status: AC
  Administered 2018-10-06: 250 mg via INTRAMUSCULAR

## 2018-10-06 MED ORDER — AZITHROMYCIN 250 MG PO TABS
ORAL_TABLET | ORAL | Status: AC
  Filled 2018-10-06: qty 4

## 2018-10-06 MED ORDER — CEFTRIAXONE SODIUM 250 MG IJ SOLR
INTRAMUSCULAR | Status: AC
  Filled 2018-10-06: qty 250

## 2018-10-06 MED ORDER — AZITHROMYCIN 250 MG PO TABS
1000.0000 mg | ORAL_TABLET | Freq: Once | ORAL | Status: AC
  Administered 2018-10-06: 1000 mg via ORAL

## 2018-10-06 NOTE — ED Provider Notes (Signed)
[facility]    CSN: [id number] Arrival date & time: 10/06/18  1107     History   Chief Complaint Chief Complaint  Patient presents with  . Exposure to STD    HPI Ian Morgan is a 23 y.o. male.   Ian Morgan presents with concerns for exposure to chlamydia. States his partner was just treated with positive screening for what he thinks was chlamydia. Denies any symptoms. No pelvic pain. No fevers or chills. Does not use condoms with his partner. Denies sores, lesions, redness or swelling of the scrotum. History of asthma.    ROS per HPI.      Past Medical History:  Diagnosis Date  . Asthma     There are no active problems to display for this patient.   Past Surgical History:  Procedure Laterality Date  . NERVE AND TENDON REPAIR  09/04/2012   Procedure: NERVE AND TENDON REPAIR;  Surgeon: Sharma Covert, MD;  Location: [facility] OR;  Service: Orthopedics;  Laterality: Right;       Home Medications    Prior to Admission medications   Medication Sig Start Date End Date Taking? Authorizing Provider  albuterol (PROVENTIL HFA;VENTOLIN HFA) 108 (90 Base) MCG/ACT inhaler Inhale 1-2 puffs into the lungs every 6 (six) hours as needed for wheezing or shortness of breath. 09/11/17   Khatri, Hina, PA-C  albuterol (PROVENTIL) (2.5 MG/3ML) 0.083% nebulizer solution Take 3 mLs (2.5 mg total) by nebulization every 6 (six) hours as needed for wheezing or shortness of breath. 09/11/17   Khatri, Hina, PA-C  cephALEXin (KEFLEX) 500 MG capsule Take 1 capsule (500 mg total) by mouth 4 (four) times daily. Patient not taking: Reported on 10/06/2018 03/16/18   Muthersbaugh, Dahlia Client, PA-C  fluticasone (FLOVENT HFA) 44 MCG/ACT inhaler Inhale 2 puffs into the lungs 2 (two) times daily. 11/05/13   Rolan Bucco, MD  oxyCODONE-acetaminophen (PERCOCET) 5-325 MG tablet Take 1 tablet by mouth every 6 (six) hours as needed. 03/16/18   Muthersbaugh, Dahlia Client, PA-C    Family History History reviewed. No  pertinent family history.  Social History Social History   Tobacco Use  . Smoking status: Current Every Day Smoker    Packs/day: 0.50    Types: Cigarettes  . Smokeless tobacco: Never Used  Substance Use Topics  . Alcohol use: Yes    Comment: occasion  . Drug use: Yes    Types: Marijuana     Allergies   Shellfish allergy   Review of Systems Review of Systems   Physical Exam Triage Vital Signs ED Triage Vitals [10/06/18 1142]  Enc Vitals Group     BP (!) 167/84     Pulse Rate 73     Resp 18     Temp 97.6 F (36.4 C)     Temp Source Temporal     SpO2 98 %     Weight      Height      Head Circumference      Peak Flow      Pain Score 0     Pain Loc      Pain Edu?      Excl. in GC?    No data found.  Updated Vital Signs BP (!) 167/84 (BP Location: Right Arm)   Pulse 73   Temp 97.6 F (36.4 C) (Temporal)   Resp 18   SpO2 98%    Physical Exam Constitutional:      Appearance: He is well-developed.  Cardiovascular:  Rate and Rhythm: Normal rate and regular rhythm.  Pulmonary:     Effort: Pulmonary effort is normal.     Breath sounds: Normal breath sounds.  Abdominal:     Palpations: Abdomen is soft. Abdomen is not rigid.     Tenderness: There is no abdominal tenderness. There is no guarding or rebound. Negative signs include Murphy's sign and McBurney's sign.     Comments: Denies scrotal redness, swelling, pain; denies sores or lesions; gu exam deferred   Skin:    General: Skin is warm and dry.  Neurological:     Mental Status: He is alert and oriented to person, place, and time.      UC Treatments / Results  Labs (all labs ordered are listed, but only abnormal results are displayed) Labs Reviewed  RPR  HIV ANTIBODY (ROUTINE TESTING W REFLEX)  URINE CYTOLOGY ANCILLARY ONLY    EKG None  Radiology No results found.  Procedures Procedures (including critical care time)  Medications Ordered in UC Medications  azithromycin  (ZITHROMAX) tablet 1,000 mg (1,000 mg Oral Given 10/06/18 1219)  cefTRIAXone (ROCEPHIN) injection 250 mg (250 mg Intramuscular Given 10/06/18 1218)    Initial Impression / Assessment and Plan / UC Course  I have reviewed the triage vital signs and the nursing notes.  Pertinent labs & imaging results that were available during my care of the patient were reviewed by me and considered in my medical decision making (see chart for details).     Empiric treatment with azithromycin and rocephin provided.  Screening in process. Will notify of any positive findings and if any changes to treatment are needed.  Safe sex practices encouraged. Patient verbalized understanding and agreeable to plan.    Final Clinical Impressions(s) / UC Diagnoses   Final diagnoses:  Concern about STD in male without diagnosis  STD exposure     Discharge Instructions     Will notify you of any positive findings and if any changes to treatment are needed.  You may monitor your results on your MyChart online as well.   Please withhold from intercourse for the next week. Please use condoms to prevent STD's.     ED Prescriptions    None     Controlled Substance Prescriptions [place] Controlled Substance Registry consulted? Not Applicable   Georgetta Haber, NP 10/06/18 1328

## 2018-10-06 NOTE — Discharge Instructions (Signed)
Will notify you of any positive findings and if any changes to treatment are needed.   °You may monitor your results on your MyChart online as well.   °Please withhold from intercourse for the next week. °Please use condoms to prevent STD's.   °

## 2018-10-06 NOTE — ED Triage Notes (Signed)
Pt here for STD screening and treatment for chlamydia  

## 2018-10-07 LAB — HIV ANTIBODY (ROUTINE TESTING W REFLEX): HIV SCREEN 4TH GENERATION: NONREACTIVE

## 2018-10-07 LAB — URINE CYTOLOGY ANCILLARY ONLY
Chlamydia: NEGATIVE
NEISSERIA GONORRHEA: NEGATIVE
TRICH (WINDOWPATH): NEGATIVE

## 2018-10-07 LAB — RPR: RPR Ser Ql: NONREACTIVE

## 2019-02-18 ENCOUNTER — Encounter (HOSPITAL_COMMUNITY): Payer: Self-pay | Admitting: Emergency Medicine

## 2019-03-07 ENCOUNTER — Encounter (HOSPITAL_COMMUNITY): Payer: Self-pay | Admitting: Emergency Medicine

## 2019-05-21 ENCOUNTER — Emergency Department (HOSPITAL_COMMUNITY)
Admission: EM | Admit: 2019-05-21 | Discharge: 2019-05-21 | Disposition: A | Discharge status: Home / Self Care | Payer: Medicaid Other | Attending: Emergency Medicine | Admitting: Emergency Medicine

## 2019-05-21 ENCOUNTER — Other Ambulatory Visit: Payer: Self-pay

## 2019-05-21 ENCOUNTER — Encounter (HOSPITAL_COMMUNITY): Payer: Self-pay

## 2019-05-21 DIAGNOSIS — F17210 Nicotine dependence, cigarettes, uncomplicated: Secondary | ICD-10-CM | POA: Insufficient documentation

## 2019-05-21 DIAGNOSIS — H1033 Unspecified acute conjunctivitis, bilateral: Secondary | ICD-10-CM | POA: Insufficient documentation

## 2019-05-21 DIAGNOSIS — J45909 Unspecified asthma, uncomplicated: Secondary | ICD-10-CM | POA: Insufficient documentation

## 2019-05-21 DIAGNOSIS — Z79899 Other long term (current) drug therapy: Secondary | ICD-10-CM | POA: Insufficient documentation

## 2019-05-21 MED ORDER — GENTAMICIN SULFATE 0.3 % OP SOLN: 2.0000 [drp] | OPHTHALMIC | 1 refills | Status: AC

## 2019-05-21 NOTE — ED Provider Notes (Signed)
[facility] EMERGENCY DEPARTMENT Provider Note   CSN: [id number] Arrival date & time: 05/21/19  1323     History   Chief Complaint No chief complaint on file.   HPI Ian Morgan is a 23 y.o. male.     23 year old male presents with complaint of red itchy watery eyes with purulent drainage x3 days.  No known sick contacts or contact with other persons known to have same symptoms.  No other complaints or concerns.  Patient does not wear glasses or contacts, no visual disturbance, no fevers.     Past Medical History:  Diagnosis Date  . Asthma     There are no active problems to display for this patient.   Past Surgical History:  Procedure Laterality Date  . NERVE AND TENDON REPAIR  09/04/2012   Procedure: NERVE AND TENDON REPAIR;  Surgeon: Linna Hoff, MD;  Location: [facility];  Service: Orthopedics;  Laterality: Right;        Home Medications    Prior to Admission medications   Medication Sig Start Date End Date Taking? Authorizing Provider  albuterol (PROVENTIL HFA;VENTOLIN HFA) 108 (90 Base) MCG/ACT inhaler Inhale 1-2 puffs into the lungs every 6 (six) hours as needed for wheezing or shortness of breath. 09/11/17   Khatri, Hina, PA-C  albuterol (PROVENTIL) (2.5 MG/3ML) 0.083% nebulizer solution Take 3 mLs (2.5 mg total) by nebulization every 6 (six) hours as needed for wheezing or shortness of breath. 09/11/17   Khatri, Hina, PA-C  fluticasone (FLOVENT HFA) 44 MCG/ACT inhaler Inhale 2 puffs into the lungs 2 (two) times daily. 11/05/13   Malvin Johns, MD  gentamicin (GARAMYCIN) 0.3 % ophthalmic solution Place 2 drops into both eyes every 4 (four) hours. 05/21/19   Tacy Learn, PA-C    Family History No family history on file.  Social History Social History   Tobacco Use  . Smoking status: Current Every Day Smoker    Packs/day: 0.50    Types: Cigarettes  . Smokeless tobacco: Never Used  Substance Use Topics  . Alcohol use: Yes    Comment:  occasion  . Drug use: Yes    Types: Marijuana     Allergies   Shellfish allergy   Review of Systems Review of Systems  Constitutional: Negative for fever.  HENT: Negative for congestion, facial swelling, sinus pressure and sinus pain.   Eyes: Positive for discharge, redness and itching. Negative for photophobia and visual disturbance.  Respiratory: Negative for cough.   Musculoskeletal: Negative for arthralgias and myalgias.  Skin: Negative for rash and wound.  Allergic/Immunologic: Negative for immunocompromised state.  Neurological: Negative for headaches.  Hematological: Negative for adenopathy.  All other systems reviewed and are negative.    Physical Exam Updated Vital Signs BP 138/80 (BP Location: Left Arm)   Pulse 81   Temp 98.6 F (37 C) (Oral)   Resp 18   SpO2 99%   Physical Exam Vitals signs and nursing note reviewed.  Constitutional:      General: He is not in acute distress.    Appearance: He is well-developed. He is not diaphoretic.  HENT:     Head: Normocephalic and atraumatic.  Eyes:     General:        Right eye: Discharge present.        Left eye: Discharge present.    Extraocular Movements: Extraocular movements intact.     Conjunctiva/sclera:     Right eye: Right conjunctiva is injected. No chemosis.  Left eye: Left conjunctiva is injected. No chemosis.    Pupils: Pupils are equal, round, and reactive to light.     Comments: Injection bilateral conjunctiva with trace purulent drainage present in each eye.  Neck:     Musculoskeletal: Neck supple.  Pulmonary:     Effort: Pulmonary effort is normal.  Lymphadenopathy:     Cervical: No cervical adenopathy.  Skin:    General: Skin is warm and dry.     Findings: No erythema or rash.  Neurological:     Mental Status: He is alert and oriented to person, place, and time.  Psychiatric:        Behavior: Behavior normal.      ED Treatments / Results  Labs (all labs ordered are listed, but  only abnormal results are displayed) Labs Reviewed - No data to display  EKG None  Radiology No results found.  Procedures Procedures (including critical care time)  Medications Ordered in ED Medications - No data to display   Initial Impression / Assessment and Plan / ED Course  I have reviewed the triage vital signs and the nursing notes.  Pertinent labs & imaging results that were available during my care of the patient were reviewed by me and considered in my medical decision making (see chart for details).  Clinical Course as of May 20 1520  Wed May 21, 2019  477 23 year old male presents with bilateral eye redness purulent drainage and itching x3 days.  On exam appears to have conjunctivitis, suspect bacterial due to purulent drainage present.  Patient placed on gentamicin eyedrops, also recommend Zyrtec and Flonase.  Patient return to ER for new or worsening symptoms.   [LM]    Clinical Course User Index [LM] Jeannie Fend, PA-C      Final Clinical Impressions(s) / ED Diagnoses   Final diagnoses:  Acute bacterial conjunctivitis of both eyes    ED Discharge Orders         Ordered    gentamicin (GARAMYCIN) 0.3 % ophthalmic solution  Every 4 hours     05/21/19 1516           Jeannie Fend, PA-C 05/21/19 1521    Long, Arlyss Repress, MD 05/22/19 [phone]

## 2019-05-21 NOTE — ED Triage Notes (Signed)
Patient complains of 3 days of red, itching, drainage from eyes. Complains of mild burning from same

## 2019-05-21 NOTE — Discharge Instructions (Signed)
Apply drops to eyes as prescribed every 4 hours while awake.  Continue to apply drops for 4 days after redness resolves. Warm compress to the eyes to help with itching sensation.  You may also use a preservative-free eyedrops. Sure to wash hands frequently, wash things like your phone or TV remote that you touch frequently to prevent reinfecting yourself.
